# Patient Record
Sex: Male | Born: 1945 | Race: White | Hispanic: No | Marital: Married | Smoking: Former smoker
Health system: Southern US, Community
[De-identification: ages and names within clinical notes are randomized; demographics above are authoritative.]

## PROBLEM LIST (undated history)

## (undated) DIAGNOSIS — N200 Calculus of kidney: Secondary | ICD-10-CM

## (undated) DIAGNOSIS — G453 Amaurosis fugax: Secondary | ICD-10-CM

## (undated) DIAGNOSIS — N429 Disorder of prostate, unspecified: Secondary | ICD-10-CM

## (undated) DIAGNOSIS — I10 Essential (primary) hypertension: Secondary | ICD-10-CM

## (undated) DIAGNOSIS — I714 Abdominal aortic aneurysm, without rupture, unspecified: Secondary | ICD-10-CM

## (undated) HISTORY — DX: Calculus of kidney: N20.0

## (undated) HISTORY — DX: Abdominal aortic aneurysm, without rupture, unspecified: I71.40

## (undated) HISTORY — DX: Disorder of prostate, unspecified: N42.9

## (undated) HISTORY — DX: Essential (primary) hypertension: I10

## (undated) HISTORY — DX: Amaurosis fugax: G45.3

## (undated) HISTORY — DX: Abdominal aortic aneurysm, without rupture: I71.4

---

## 1998-06-12 HISTORY — PX: URETERAL STENT PLACEMENT: SHX822

## 2006-06-12 HISTORY — PX: FOOT SURGERY: SHX648

## 2006-06-12 HISTORY — PX: TOTAL KNEE ARTHROPLASTY: SHX125

## 2006-06-12 HISTORY — PX: CERVICAL SPINE SURGERY: SHX589

## 2006-12-21 ENCOUNTER — Ambulatory Visit (HOSPITAL_COMMUNITY): Admission: RE | Admit: 2006-12-21 | Discharge: 2006-12-22 | Payer: Self-pay | Admitting: Neurosurgery

## 2007-03-07 ENCOUNTER — Inpatient Hospital Stay (HOSPITAL_COMMUNITY): Admission: RE | Admit: 2007-03-07 | Discharge: 2007-03-11 | Payer: Self-pay | Admitting: Orthopedic Surgery

## 2007-04-04 ENCOUNTER — Encounter: Admission: RE | Admit: 2007-04-04 | Discharge: 2007-04-26 | Payer: Self-pay | Admitting: Orthopedic Surgery

## 2007-04-30 ENCOUNTER — Inpatient Hospital Stay (HOSPITAL_COMMUNITY): Admission: RE | Admit: 2007-04-30 | Discharge: 2007-05-03 | Payer: Self-pay | Admitting: Orthopedic Surgery

## 2007-05-28 ENCOUNTER — Encounter: Admission: RE | Admit: 2007-05-28 | Discharge: 2007-06-12 | Payer: Self-pay | Admitting: Orthopedic Surgery

## 2007-06-13 ENCOUNTER — Encounter: Admission: RE | Admit: 2007-06-13 | Discharge: 2007-09-11 | Payer: Self-pay | Admitting: Orthopedic Surgery

## 2007-09-12 ENCOUNTER — Encounter: Admission: RE | Admit: 2007-09-12 | Discharge: 2007-12-11 | Payer: Self-pay | Admitting: Orthopedic Surgery

## 2010-10-25 NOTE — Discharge Summary (Signed)
NAMEMOLLY, MASELLI              ACCOUNT NO.:  [id number]   MEDICAL RECORD NO.:  [id number]          PATIENT TYPE:  INP   LOCATION:  1607                         FACILITY:  [facility]   PHYSICIAN:  Madlyn Frankel. Charlann Boxer, M.D.  DATE OF BIRTH:  1945-10-24   DATE OF ADMISSION:  03/07/2007  DATE OF DISCHARGE:  03/11/2007                               DISCHARGE SUMMARY   ADMISSION DIAGNOSES:  1. Osteoarthritis.  2. Hypertension.  3. Prostate disease.   DISCHARGE DIAGNOSES:  1. Osteoarthritis.  2. Hypertension.  3. Prostate disease.   CONSULTATION:  None.   PROCEDURE:  Right total knee replacement by surgeon Dr. Durene Romans.  Assistant Dwyane Luo.   HISTORY OF PRESENT ILLNESS:  Mr. Batzel is a 65 year old male patient  with persistent progressive right knee pain secondary to osteoarthritis  that has been refractory all conservative treatments.   LABORATORY DATA:  Preadmission CBC:  Hematocrit 39, tracked throughout,  at discharge 27.5 and stable.  Coags negative.  Routine chemistries:  No  significant abnormalities.  Kidney function normal.  Calcium 8 at  discharge.  GI:  Negative.  UA showed some large bilirubin, otherwise  negative.   EKG:  Normal sinus rhythm.   Radiology:  Chest x-ray impression found in chart.   HOSPITAL COURSE:  The patient underwent right total knee replacement.  He tolerated the procedure well and was admitted to the orthopedic  floor.  Pain was well-controlled throughout.  He remained afebrile  throughout his course of stay.  DVT prophylaxis was started on postop  day #1.  Dressing was changed on a daily basis after postop day #1.  Wound had no significant drainage throughout his course of stay.  No  sign of infection.  He was PT, OT, weightbearing as tolerated.  Celebrex  was continued for 2 days after surgery.  He did have some issues with  constipation and was given some magnesium citrate which had resolved  this prior to his discharge.  He progressed  nicely with this physical  therapy and was able to ambulate at least 50 feet prior to his discharge  home with the use of a rolling walker.  No significant events during his  course of stay.   DISCHARGE DISPOSITION:  Discharged home with home health care, PT and  Lovenox teaching, in stable and improved condition.   DISCHARGE PHYSICAL THERAPY:  Goals of physical therapy to be to minimize  pain, maximize strength, increase range of motion, with goals of therapy  0 to 100 at 2 weeks, 0 to 120 at 6 weeks.  Work on Investment banker, operational and  proprioception.   DISCHARGE WOUND CARE:  Keep wound dry.   DISCHARGE DIET:  Regular, as tolerated by the patient.   DISCHARGE FOLLOWUP:  Follow up with Dr. Charlann Boxer at [place], [phone], in 2  weeks.   DISCHARGE MEDICATIONS:  1. HCTZ 12.5 mg 1 p.o. q.a.m.  2. Lyrica 75 mg 1 p.o. q.a.m.  3. Lovenox 30 mg subcu q.12h. x11 days.  4. Robaxin 500 mg p.o. q.6h. muscle spasm.  5. Iron 325 mg 1 p.o. t.i.d. x3 weeks.  6. Enteric-coated aspirin 325 mg 1 p.o. daily x4 weeks after Lovenox      completed.  7. Colace 100 mg p.o. b.i.d. constipation.  8. MiraLax 17 grams p.o. daily constipation.  9. Vicodin 5/325 1-2 p.o. q.4-6h. p.r.n. pain.   SPECIAL INSTRUCTIONS:  TED hose on 12 off 12 hours.     ______________________________  Yetta Glassman [address], [place]      Madlyn Frankel. Charlann Boxer, M.D.  Electronically Signed    BLM/MEDQ  D:  03/26/2007  T:  03/26/2007  Job:  [id number]   cc:   Birdena Jubilee, P.A.-C.

## 2010-10-25 NOTE — H&P (Signed)
Troy Blackburn, Troy Blackburn              ACCOUNT NO.:  [id number]   MEDICAL RECORD NO.:  [id number]          PATIENT TYPE:  INP   LOCATION:  NA                           FACILITY:  [facility]   PHYSICIAN:  Troy Blackburn, M.D.  DATE OF BIRTH:  1946-04-02   DATE OF ADMISSION:  04/30/2007  DATE OF DISCHARGE:                              HISTORY & PHYSICAL   PROCEDURE:  Left total knee arthroplasty.   CHIEF COMPLAINT:  Left knee pain.   HISTORY OF PRESENT ILLNESS:  65 year old male with a history of left  knee pain secondary to osteoarthritis refractory to all conservative  treatment.  He does have a history of recent right total knee  replacement and is doing very well.  He has been presurgically assessed  and cleared by Dr. Franky Blackburn.   PAST MEDICAL HISTORY:  1. Osteoarthritis with a recent right total knee replacement.  2. Hypertension.  3. Prostate disease.   PAST SURGICAL HISTORY:  1. Right total knee replacement in September of 2008.  2. Foot surgery in June of 2008.  3. Spine fusion in July of 2008.  4. Stent placement for kidney stones in 2000.   FAMILY HISTORY:  Stroke.   SOCIAL HISTORY:  The patient is married to Troy Blackburn.  Primary caregiver  will be his wife after surgery.   ALLERGIES:  CODEINE WHICH CAUSES ITCHING.   MEDICATIONS:  1. HCTZ, unknown dosage amount, one p.o. daily.  2. Enteric-coated aspirin 325 mg one p.o. daily.  3. Celebrex 100 mg p.o. b.i.d. x2 days after surgery.   REVIEW OF SYSTEMS:  See HPI.   PHYSICAL EXAMINATION:  VITAL SIGNS:  Pulse 72, respirations 18, blood  pressure 108/80.  GENERAL:  Awake, alert, and oriented.  Well-developed, well-nourished,  no acute distress.  NECK:  Supple.  No carotid bruits.  CHEST:  Lungs clear to auscultation bilaterally.  BREASTS:  Deferred.  HEART:  Regular rate and rhythm without gallops, clicks, rubs, or  murmurs.  ABDOMEN:  Soft, nontender, nondistended.  Bowel sounds present.  GENITOURINARY:  Deferred.  EXTREMITIES:  Right knee has 0 to 110-degree range of motion.  Left knee  varus deformity, does come out to full extension.  Flexion back to  approximately 95-100 degrees.  Does have a prominence over tibial  tuberosity on the Osgood-Schlatter area.  SKIN:  Right knee well-healed incision.  No signs of cellulitis on  either lower extremity.  NEUROLOGIC:  Intact distal sensibilities.   LABORATORY DATA:  Labs, EKG, chest x-ray pending presurgical testing.   IMPRESSION:  1. Osteoarthritis.  2. Hypertension.   PLAN:  Left total knee arthroplasty by surgeon Dr. Durene Blackburn.  The  risks and complications were discussed.   Postoperative medications including Lovenox Robaxin, iron, aspirin,  Colace, MiraLax provided at time of history and physical.  Celebrex also  provided for perioperative use.  Will be continued for 2 days after  surgery.  Pain medicines will be provided at time of surgery.     ______________________________  Troy Blackburn, [place]      Troy Blackburn, M.D.  Electronically Signed  BLM/MEDQ  D:  04/18/2007  T:  04/19/2007  Job:  [id number]   cc:   Troy Blackburn, M.D.  Fax: [phone]

## 2010-10-25 NOTE — Op Note (Signed)
Troy Blackburn, Troy Blackburn              ACCOUNT NO.:  [id number]   MEDICAL RECORD NO.:  [id number]          PATIENT TYPE:  OIB   LOCATION:  3172                         FACILITY:  [facility]   PHYSICIAN:  [name], M.D.     DATE OF BIRTH:  12/21/1945   DATE OF PROCEDURE:  12/21/2006  DATE OF DISCHARGE:                               OPERATIVE REPORT   PREOPERATIVE DIAGNOSIS:  1. Cervical spondylosis with myelopathy C3-C4.  2. Cervical degenerative disc disease with myelopathy.  3. Cervical radiculopathy.  4. Cervical stenosis.   POSTOPERATIVE DIAGNOSES:  1. Cervical spondylosis with myelopathy C3-C4.  2. Cervical degenerative disc disease with myelopathy.  3. Cervical radiculopathy.  4. Cervical stenosis.   PROCEDURE:  1. Anterior cervical decompression C3-C4.  2. Arthrodesis C3-C4 with 7 mm structural allograft.  3. Anterior instrumentation, 60 mm of Vectra plate.   INDICATIONS:  Troy Blackburn is a 65 year old who presented with  evidence of cervical myelopathy secondary to spinal cord stenosis at C3-  C4 with spinal cord signal.   DESCRIPTION OF PROCEDURE:  Troy Blackburn was brought to the operating room,  intubated, and placed under general anesthetic without difficulty.  His  head was positioned on a horseshoe headrest in a neutral position.  His  neck was prepped and he was draped in a sterile fashion.  I infiltrated  4 mL 0.5% lidocaine with 1:200,000 epinephrine.  I then opened the neck  with a #10 blade and I took this down to the platysma sharply with a #10  blade.  I dissected rostrally and caudally in a plane superior to the  platysma with Metzenbaum scissors.  I divided the platysma horizontally  with the same Metzenbaum scissors.  I then dissected rostrally and  caudally inferior to the platysma.  I was able to, with both blunt and  sharp dissection, create an avascular corridor to the cervical spine.  I  placed a spinal needle which, on x-ray, was at C3-C4.  Using that  as a  guide, I over reflected the longus colli muscles bilaterally and placed  self-retaining retractors.  I also opened the disc space at C3-C4.  I  then removed some of the disc material.  I placed two distraction pins,  one at C3, the other in C4, distracted the disc space, and proceeded  with the decompression.   I completed cervical decompression via curets, Kerrison punches, a high  speed drill, and pituitary rongeurs.  I drilled down the vertebral  bodies until I got to the osteophytes posteriorly.  I removed those by  drilling them until they were quite thin and then I was able to bite  them away using Kerrison punches both rostrally and caudally.  After  thorough decompression of the osteophytes, I opened the posterior  longitudinal ligament and removed that to fully decompress the spinal  canal.  This was also done for both C4 nerve roots.  After the  decompression was achieved, I turned towards the arthrodesis.   I was able to drill away more of the vertebral bodies to make sure I had  even spaces to  accept the bone graft.  I sized the space and placed a 7  mm graft into the space.  This was a Synthes ACS graft, I believe.  I  then prepared for anterior instrumentation by removing the distraction  pins.  I sized the space and felt that a 16 mm Vectra plate would be  appropriate.  With Dr. Verlee Rossetti assistance, I placed a plate by putting  two screws into the body of C3, two screws into the body of C4, first by  drilling then using self-tapping screws.  X-ray showed the plate, plugs  and screws to be in good position.   I closed the wound in a layered fashion using Vicryl sutures to  reapproximate the platysma and subcuticular layers.  Dermabond was used  for sterile dressing.  Troy Blackburn tolerated the procedure well.           ______________________________  [name], M.D.     KC/MEDQ  D:  12/21/2006  T:  12/22/2006  Job:  [id number]

## 2010-10-25 NOTE — Op Note (Signed)
NAMEESSAM, LOWDERMILK              ACCOUNT NO.:  [id number]   MEDICAL RECORD NO.:  [id number]          PATIENT TYPE:  INP   LOCATION:  1619                         [facility]ACILITY:  [facility]   PHYSICIAN:  Madlyn Frankel. Charlann Boxer, M.D.  DATE OF BIRTH:  Jul 09, 1945   DATE OF PROCEDURE:  04/30/2007  DATE OF DISCHARGE:                               OPERATIVE REPORT   PREOPERATIVE DIAGNOSIS:  Left knee osteoarthritis.   POSTOPERATIVE DIAGNOSIS:  Left knee osteoarthritis.   PROCEDURE:  Left total knee replacement using DePuy rotating platform  posterior stabilized knee system, size 5 femur, 4 tibia, 10 mm insert,  41 patellar button.   SURGEON:  Madlyn Frankel. Charlann Boxer, M.D.   ASSISTANT:  Yetta Glassman. Mann, P.A.-C.   ANESTHESIA:  Duramorph spinal.   COMPLICATIONS:  None.   DRAINS:  One.   TOURNIQUET TIME:  48 minutes at 250 mmHg.   DISPOSITION:  Stable to the recovery room.   INDICATIONS FOR PROCEDURE:  Mr. Muse is a 65 year old gentleman with  advanced bilateral knee osteoarthritis status post right total knee  replacement.  He has done extremely well and he is ready for scheduled  left total knee.  The risks and benefits were reviewed as he is recently  postop and consent was obtained.   PROCEDURE IN DETAIL:  The patient was brought to operating theater.  Once adequate anesthesia and preoperative antibiotics were administered,  the patient was positioned supine.  A proximal thigh tourniquet was  placed.  The left lower extremity was pre-scrubbed, prepped and draped  in a sterile fashion.  A midline incision was made followed by median  parapatellar arthrotomy.  Following this, a debridement including a  large calcified body within the inferior aspect of the patella.  Attention was directed to the patella.  Patella debridement removed  superior synovium.  Precut measurement was 23 mm.  I resected down to 14  mm, I used a 41 button.  I did debride the lateral facet so there was no  overhang bone  after placement of the button.   A metal shim was placed to protect the patella from the retractors. Now,  attention was turned to the femur.  The femoral canal was opened with  the drill and irrigated to prevent fat emboli.  The intramedullary rod  was passed and at 5 degrees valgus, I resected 10 mm of bone off the  distal femur.  I then sized the femur and it was a size 5.  The  anterior, posterior, and chamfer cuts were made based off the posterior  condylar axis which meant that the epicondylar axis, as well, was  perpendicular to the [employer].  The anterior, posterior, and  chamfer cuts were made without difficulty.  The final box cut was made  off the lateral aspect of the distal femur.  The tibia was subluxated  forward and further debridement of the menisci and cruciate ligament  stumps was carried out.  Then, using the extramedullary guide, I  resected 10 mm of bone off of the proximal tibia based on the proximal  lateral tibia.  Following this  cut, I debrided some of the medial  osteophytes and drilled the sclerotic bone on the medial proximal tibia.  A size 4 tibial tray seemed to fit best on this knee.  I checked my  position and alignment, and I was happy with the cut in both AP and  lateral planes.  I pinned the guide in position through the medial third  of the tubercle, and drilled and keel punched the tibia.  A trial  reduction was carried out with a 10 mm insert and the knee came out to  full extension and was stable through flexion.   All trial components were removed.  I used the laminar spreader at this  point to debride the osteophytes off the posterior, medial, and lateral  aspect of the knee.  There were no palpable loose bodies.  At this  point, the knee was irrigated with normal saline solution and pulse  lavaged.  I injected the synovial capsular layer with 60 mL of 0.25%  Marcaine with epinephrine and 1 mL of Toradol.  At this point, the final   components were cemented into position.  The knee was brought out to  full extension with a 10 mm insert.  Extruded cement was removed.  Once  the cement had cured, I removed the remaining cement that was visible, I  was unable to visualize any cement medially and laterally.  The final  size 5 by 10 mm insert was then placed without difficulty.  I did place  5 mL of FloSeal into the medial and lateral gutters as well as  posterior.  A medium Hemovac drain was placed deep.  The knee was  brought to flexion.  The extensor mechanism was closed with a #1 Vicryl.  The remainder of the wound was closed in layers with 2-0 Vicryl and a  running 4-0 Monocryl.  The knee was cleaned, dried, and dressed  sterilely with Steri-Strips and a bulky dressing.  He was brought to the  recovery room in stable condition.      Madlyn Frankel Charlann Boxer, M.D.  Electronically Signed     MDO/MEDQ  D:  04/30/2007  T:  04/30/2007  Job:  [id number]

## 2010-10-25 NOTE — H&P (Signed)
Troy Blackburn, Troy Blackburn              ACCOUNT NO.:  [id number]   MEDICAL RECORD NO.:  [id number]         PATIENT TYPE:  LINP   LOCATION:                               FACILITY:  [facility]   PHYSICIAN:  Madlyn Frankel. Charlann Boxer, M.D.  DATE OF BIRTH:  1945-06-17   DATE OF ADMISSION:  03/07/2007  DATE OF DISCHARGE:                              HISTORY & PHYSICAL   DATE OF ADMISSION:  Will be March 07, 2007   ATTENDING PHYSICIAN:  Dr. Durene Blackburn.   PROCEDURE:  Right total knee arthroplasty.   CHIEF COMPLAINT:  Right knee pain.   HISTORY OF PRESENT ILLNESS:  65 year old male with a history persistent  progressive right knee pain secondary to osteoarthritis.  It has been  refractory to all conservative treatments.  He has been presurgical  assessed by Troy Cradle PA-C and Dr. Franky Blackburn with [facility].   PAST MEDICAL HISTORY:  1. Osteoarthritis  2. Hypertension  3. Prostate disease.   PAST SURGICAL HISTORIES:  Foot in June 2008, spine fusion in July 2008,  unknown surgery to put in stent in 2000.   FAMILY HISTORY:  Stroke.   SOCIAL HISTORY:  The patient is married.  Primary caregiver after  surgery will be wife.   DRUG ALLERGIES:  CODEINE which causes itching.   MEDICATIONS:  1. Etodolac 600 mg p.o. b.i.d.  2. Hydrochlorothiazide unknown dosage amount one a day  3. Lyrica unknown dosage amount, one a day next  4. Celebrex 200 mg p.o. b.i.d. x2 days after surgery.   REVIEW OF SYSTEMS:  See HPI.   PHYSICAL EXAM:  Pulse 64, respirations 18, blood pressure 128/84.  GENERAL:  Awake, alert and oriented, well-developed, well-nourished, no  acute distress.  NECK:  Supple.  No carotid bruits.  CHEST/LUNGS:  Clear to auscultation bilaterally.  BREASTS:  Deferred.  HEART:  Regular rate and rhythm without gallops, clicks, rubs or  murmurs.  ABDOMEN:  Soft, nontender, nondistended.  Bowel sounds present.  GENITOURINARY:  Deferred.  EXTREMITIES:  Has increased pain with  range of motion of right knee.  Wears brace.  Also wears brace on left knee and has significant left  knee pain as well.  SKIN:  No skin breakdown.  No cellulitis.  NEUROLOGIC:  Intact distal sensibilities.   Labs, EKG, chest x-ray all pending.   IMPRESSION:  1. Osteoarthritis  2. History cervical spine surgery.   PLAN OF ACTION:  Right total knee arthroplasty March 07, 2007 at  [facility] by surgeon Dr. Durene Blackburn.  Risks and  complications were discussed.  Questions were encouraged, answered and  reviewed.   POSTOPERATIVE MEDICATIONS:  Including Lovenox Robaxin, iron, aspirin,  Colace, MiraLax were provided at time of history and physical.  Postoperative pain medication will need to be prescribed at time of  surgery.     ______________________________  Troy Blackburn Troy Blackburn, [place]      Madlyn Frankel. Charlann Boxer, M.D.  Electronically Signed    BLM/MEDQ  D:  02/21/2007  T:  02/21/2007  Job:  [id number]   cc:   Troy Blackburn, M.D.  Fax: [phone]

## 2010-10-25 NOTE — Op Note (Signed)
Troy Blackburn, Troy Blackburn              ACCOUNT NO.:  [id number]   MEDICAL RECORD NO.:  [id number]          PATIENT TYPE:  INP   LOCATION:  1607                         FACILITY:  [facility]   PHYSICIAN:  Madlyn Frankel. Charlann Boxer, M.D.  DATE OF BIRTH:  05-Nov-1945   DATE OF PROCEDURE:  03/07/2007  DATE OF DISCHARGE:                               OPERATIVE REPORT   PREOPERATIVE DIAGNOSIS:  Right knee end stage osteoarthritis with severe  varus deformity.   POSTOPERATIVE DIAGNOSIS:  Right knee end stage osteoarthritis with  severe varus deformity.   PROCEDURE:  Right total knee replacement.   COMPONENTS USED:  DePuy rotating platform posterior stabilized knee  system, size 5 femur, 5 tibia, 12.5 mm insert, 41 patellar button.   SURGEON:  Madlyn Frankel. Charlann Boxer, M.D.   ASSISTANT:  Yetta Glassman. Mann, P.A.-C.   ANESTHESIA:  Duramorph spinal.   DRAINS:  One.   TOURNIQUET TIME:  55 minutes at 250 mmHg.   COMPLICATIONS:  None.   INDICATIONS FOR PROCEDURE:  Mr. Kemmerling is a 65 year old gentleman who  presented to office on referral for evaluation of bilateral knee pain.  He has a history of lumbar spine surgery with persistent knee  discomfort.  Radiographs revealed end stage bilateral knee  osteoarthritis with severe varus deformity.  Conservative measures  failed to provide relief and he wished to proceed with surgical  intervention based on his decreased quality of life.  The risks of  infection, DVT, component failure, need for revision surgery, were all  discussed and reviewed.  Consent was obtained.   PROCEDURE IN DETAIL:  The patient was brought to the operating theater.  Once adequate anesthesia and preoperative antibiotics, Ancef, were  administered, the patient was positioned supine.  A proximal thigh  tourniquet was placed.  The right lower extremity was pre-scrubbed and  prepped and draped in a sterile fashion.  A midline incision was made  which was somewhat paramidline due to the fact he had  a very large  prominent tibial tubercle.  Medial parapatellar arthrotomy was created  to allow for patella subluxation.  Following initial debridement, first  attention was directed to the patella. Precut measurement was 23 mm.  I  resected down to 14 mm and used a 41 patellar button.  I did debride  some of the lateral facet of the patella and remaining osteophytes to  shape the patella to a more anatomic position.   At this point, attention was directed to the femur.  The femoral canal  was opened and irrigated to prevent fat emboli.  An intramedullary rod  was passed and at 5 degrees of valgus, I chose to resect 11 mm of bone  due to a 5 degrees flex contracture.  Following this cut, I sized the  femur to be a size 5.  Anterior, posterior, and chamfer cuts were based  off the posterior condylar axis which was perpendicular to [place]  line and the AP axis.  Final box cut preparation was carried out based  off the lateral aspect of distal femur.   Attention was now directed to the tibia. With  tibial subluxation and  further debridement of the meniscus and stump of the PCL, an  extramedullary guide was used and I resected 10 mm of bone off the  proximal tibia.  Following this cut, I used extension block to determine  that the knee came out to at least extension at this point.  The pins  were removed and attention was now directed to the posterior aspect of  the knee. I utilized the 5/8 osteotome to remove osteophytes and a curet  to remove these.  Following this, the final tibial preparation was  carried out with size 5 tibial tray.  The alignment rod passed to the  center of the ankle indicating a good perpendicular cut.   At this point, the drill and keel punch were carried out and a trial  reduction now carried out. With a 12.5 poly, the knee came out to full  extension and appeared to be stable from extension into flexion. Given  all these parameters and knowing that the  patella tracked without  application of any pressure, the trial components were removed and the  knee was irrigated with normal saline solution and pulse lavage.  The  final components were opened and cement mixed.  The knee was injected 60  mL of 0.25% Marcaine with epinephrine and 1 mL of Toradol.  Once this  was done, the components were cemented into position.  The knee was  brought to extension with the 12.5 poly and the extruded excessive  cement removed.  Once the cement had cured, I made sure there was no  excessive cement throughout the knee.  Once I was satisfied there was  none, the final 12.5 poly was inserted.   The knee was again irrigated with pulse lavage.  I then injected 5 mL of  FloSeal into the medial and lateral gutters.  The medium Hemovac drain  was placed deep.  The extensor mechanism was then reapproximated in  flexion with #1 Vicryl.  The remainder of the wound was closed in  layers, 2-0 Vicryl and a 4-0 running Monocryl.  The knee was cleaned,  dried, and dressed sterilely with Steri-Strips and a bulky sterile  dressing.  He is brought to the recovery room in stable condition.      Madlyn Frankel Charlann Boxer, M.D.  Electronically Signed     MDO/MEDQ  D:  03/07/2007  T:  03/07/2007  Job:  [id number]

## 2010-10-25 NOTE — Discharge Summary (Signed)
NAMESHERRON, MUMMERT              ACCOUNT NO.:  [id number]   MEDICAL RECORD NO.:  [id number]          PATIENT TYPE:  INP   LOCATION:  1619                         FACILITY:  [facility]   PHYSICIAN:  Madlyn Frankel. Charlann Boxer, M.D.  DATE OF BIRTH:  31-Jan-1946   DATE OF ADMISSION:  04/30/2007  DATE OF DISCHARGE:  05/03/2007                               DISCHARGE SUMMARY   DISCHARGE DIAGNOSES:  1. Osteoarthritis.  2. Hypertension.  3. Prostate disease.   DISCHARGE DIAGNOSES:  1. Osteoarthritis.  2. Hypertension.  3. Prostate disease.   CONSULTATION:  None.   PROCEDURE:  Left total knee replacement.  Surgeon:  Dr. Durene Romans.  Assistant:  Dwyane Luo PA-C.   BRIEF HISTORY OF PRESENT ILLNESS:  A 65 year old male with history of  left knee pain secondary to osteoarthritis refractory to all  conservative treatment.  Does have a history of a recent right total  knee replacement and is doing very well.  Was presurgically assessed and  cleared by [name].   LABORATORY DATA:  CBC shows hematocrit to be 28.9,  at discharge was  28.7.  Routine chemistries all within normal limits with no significant  changes and stable at discharge with sodium 136, potassium 3.7,  creatinine 0.87, glucose 121.   Cardiology:  No EKG found on chart.   Chest two-view not found of this chart.   HOSPITAL COURSE:  The patient underwent left total knee replacement and  tolerated the procedure well.  He was admitted to the orthopedic floor.  He remained afebrile throughout his course of stay.  Dressing was  changed on a daily basis after postop day #1.  No significant drainage  from wound at any time.  He remained neurovascularly to his left lower  extremity, was able to do a positive straight leg raise with good quad  function on postop day #1.  He was PT, OT weightbearing as tolerated.  We did hold his blood pressure medications for the first day due to low  systolic pressure.  He progressed nicely with PT and OT,  was  weightbearing as tolerated, was able to ambulate at least 50 feet prior  to discharge.   On postop day #3, doing well.  Dressing change on wound looked great,  was weightbearing as tolerated and ready for discharge home.   DISCHARGE DISPOSITION:  Discharged home with home health care PT and  Lovenox teaching.   DISCHARGE PHYSICAL THERAPY:  Goals of physical therapy:  Work on gait  training, proprioception, and range of motion with goals for 0-100 at 2  weeks and 0-120 at 6 weeks.   DISCHARGE DIET:  Regular as tolerated by the patient.   DISCHARGE WOUND CARE:  Keep dry.   DISCHARGE MEDICATIONS:  1. Hydrochlorothiazide 12.5 mg one p.o. daily.  2. Celebrex 200 mg p.o. b.i.d. x2 days and then daily for 2 more days.  3. Lovenox 40 mg subcutaneously q. 24 h x11 days.  4. Robaxin 500 mg p.o. q. 6 h.  5. Vicodin 5/325 one to two p.o. q. 4-6 h p.r.n. pain.   DISCHARGE INSTRUCTIONS:  1.  Follow up with Dr. Charlann Boxer, phone number [phone] in 10-14 days for      wound check.  2. He is weightbearing as tolerated.  3. TED hose on 12 hours, off 12 hours.     ______________________________  Yetta Glassman [address], [place]      Madlyn Frankel. Charlann Boxer, M.D.  Electronically Signed    BLM/MEDQ  D:  05/27/2007  T:  05/27/2007  Job:  [id number]   cc:   [name], M.D.  Fax: [phone]

## 2011-03-21 LAB — CBC
MCHC: 34.8
MCHC: 34.9
MCV: 89.5
MCV: 89.4
Platelets: 198
RBC: 3.2 — ABNORMAL LOW
RBC: 4.47
RDW: 13.5
WBC: 4.1
WBC: 5

## 2011-03-21 LAB — BASIC METABOLIC PANEL
BUN: 10
CO2: 26
CO2: 27
Calcium: 7.9 — ABNORMAL LOW
Chloride: 105
Creatinine, Ser: 0.87
Creatinine, Ser: 0.95
GFR calc Af Amer: >60
Glucose, Bld: 108 — ABNORMAL HIGH

## 2011-03-21 LAB — COMPREHENSIVE METABOLIC PANEL
ALT: 17
AST: 16
Alkaline Phosphatase: 89
CO2: 26
Chloride: 104
Creatinine, Ser: 0.89
GFR calc Af Amer: >60
GFR calc non Af Amer: >60
Total Bilirubin: 0.8

## 2011-03-21 LAB — URINALYSIS, ROUTINE W REFLEX MICROSCOPIC
Bilirubin Urine: NEGATIVE
Glucose, UA: NEGATIVE
Hgb urine dipstick: NEGATIVE
Ketones, ur: NEGATIVE
pH: 7

## 2011-03-21 LAB — APTT: aPTT: 31

## 2011-03-23 LAB — BASIC METABOLIC PANEL
BUN: 11
Calcium: 8.2 — ABNORMAL LOW
GFR calc non Af Amer: >60
GFR calc non Af Amer: >60
Glucose, Bld: 96
Potassium: 4
Sodium: 139
Sodium: 142

## 2011-03-23 LAB — CBC
HCT: 27.5 — ABNORMAL LOW
HCT: 39
Hemoglobin: 13.3
Hemoglobin: 9.6 — ABNORMAL LOW
MCHC: 34.1
Platelets: 160
RBC: 2.99 — ABNORMAL LOW
RDW: 13
RDW: 13.2
WBC: 4.6

## 2011-03-23 LAB — COMPREHENSIVE METABOLIC PANEL
ALT: 22
AST: 21
Albumin: 3.9
CO2: 31
Chloride: 106
Creatinine, Ser: 0.84
Total Bilirubin: 1.2

## 2011-03-23 LAB — APTT: aPTT: 30

## 2011-03-23 LAB — URINALYSIS, ROUTINE W REFLEX MICROSCOPIC
Glucose, UA: NEGATIVE
Nitrite: NEGATIVE
Specific Gravity, Urine: 1.021
pH: 6

## 2011-03-23 LAB — TYPE AND SCREEN: Antibody Screen: NEGATIVE

## 2011-03-23 LAB — ABO/RH: ABO/RH(D): A POS

## 2011-03-28 LAB — BASIC METABOLIC PANEL
BUN: 17
Chloride: 107
Glucose, Bld: 97
Potassium: 4.5

## 2011-03-28 LAB — CBC
HCT: 37.9 — ABNORMAL LOW
MCV: 90
Platelets: 219
WBC: 5

## 2013-09-29 ENCOUNTER — Encounter: Payer: Self-pay | Admitting: Family Medicine

## 2013-09-29 DIAGNOSIS — G453 Amaurosis fugax: Secondary | ICD-10-CM

## 2013-09-29 HISTORY — DX: Amaurosis fugax: G45.3

## 2017-08-04 ENCOUNTER — Emergency Department (HOSPITAL_COMMUNITY)
Admission: EM | Admit: 2017-08-04 | Discharge: 2017-08-04 | Disposition: A | Discharge status: Home / Self Care | Payer: Medicare Other | Attending: Emergency Medicine | Admitting: Emergency Medicine

## 2017-08-04 ENCOUNTER — Emergency Department (HOSPITAL_COMMUNITY): Payer: Medicare Other

## 2017-08-04 ENCOUNTER — Encounter (HOSPITAL_COMMUNITY): Payer: Self-pay | Admitting: *Deleted

## 2017-08-04 DIAGNOSIS — N3090 Cystitis, unspecified without hematuria: Secondary | ICD-10-CM | POA: Insufficient documentation

## 2017-08-04 DIAGNOSIS — I10 Essential (primary) hypertension: Secondary | ICD-10-CM | POA: Diagnosis not present

## 2017-08-04 DIAGNOSIS — I714 Abdominal aortic aneurysm, without rupture, unspecified: Secondary | ICD-10-CM

## 2017-08-04 DIAGNOSIS — Z96653 Presence of artificial knee joint, bilateral: Secondary | ICD-10-CM | POA: Insufficient documentation

## 2017-08-04 DIAGNOSIS — R339 Retention of urine, unspecified: Secondary | ICD-10-CM | POA: Diagnosis present

## 2017-08-04 LAB — BASIC METABOLIC PANEL
ANION GAP: 12 (ref 5–15)
BUN: 16 mg/dL (ref 6–20)
CALCIUM: 9 mg/dL (ref 8.9–10.3)
CHLORIDE: 102 mmol/L (ref 101–111)
CO2: 21 mmol/L — AB (ref 22–32)
Creatinine, Ser: 0.9 mg/dL (ref 0.61–1.24)
GFR calc non Af Amer: >60 mL/min (ref >60)
Glucose, Bld: 98 mg/dL (ref 65–99)
Potassium: 3.8 mmol/L (ref 3.5–5.1)
Sodium: 135 mmol/L (ref 135–145)

## 2017-08-04 LAB — URINALYSIS, ROUTINE W REFLEX MICROSCOPIC
Bilirubin Urine: NEGATIVE
Glucose, UA: NEGATIVE mg/dL
KETONES UR: 5 mg/dL — AB
Nitrite: POSITIVE — AB
PH: 6 (ref 5.0–8.0)
Protein, ur: 100 mg/dL — AB
Specific Gravity, Urine: 1.009 (ref 1.005–1.030)

## 2017-08-04 LAB — CBC WITH DIFFERENTIAL/PLATELET
BASOS ABS: 0 10*3/uL (ref 0.0–0.1)
Basophils Relative: 0 %
EOS PCT: 0 %
Eosinophils Absolute: 0 10*3/uL (ref 0.0–0.7)
HCT: 39.5 % (ref 39.0–52.0)
Hemoglobin: 13.1 g/dL (ref 13.0–17.0)
LYMPHS PCT: 11 %
Lymphs Abs: 1.2 10*3/uL (ref 0.7–4.0)
MCH: 30.6 pg (ref 26.0–34.0)
MCHC: 33.2 g/dL (ref 30.0–36.0)
MCV: 92.3 fL (ref 78.0–100.0)
Monocytes Absolute: 0.6 10*3/uL (ref 0.1–1.0)
Monocytes Relative: 6 %
Neutro Abs: 9.3 10*3/uL — ABNORMAL HIGH (ref 1.7–7.7)
Neutrophils Relative %: 83 %
PLATELETS: 240 10*3/uL (ref 150–400)
RBC: 4.28 MIL/uL (ref 4.22–5.81)
RDW: 13 % (ref 11.5–15.5)
WBC: 11.1 10*3/uL — AB (ref 4.0–10.5)

## 2017-08-04 LAB — LACTIC ACID, PLASMA
LACTIC ACID, VENOUS: 0.9 mmol/L (ref 0.5–1.9)
Lactic Acid, Venous: 0.7 mmol/L (ref 0.5–1.9)

## 2017-08-04 MED ORDER — CEFTRIAXONE SODIUM 1 G IJ SOLR
1.0000 g | Freq: Once | INTRAMUSCULAR | Status: AC
  Administered 2017-08-04: 1 g via INTRAVENOUS
  Filled 2017-08-04: qty 10

## 2017-08-04 MED ORDER — CEPHALEXIN 500 MG PO CAPS: 500.0000 mg | ORAL_CAPSULE | Freq: Four times a day (QID) | ORAL | 0 refills | Status: DC

## 2017-08-04 NOTE — Discharge Instructions (Signed)
Your CT scan showed an incidental finding(s):  "1) Low-density structures within the central LEFT kidney - likely parapelvic cysts.; 2) 4.2 cm abdominal aortic aneurysm. Recommend followup by ultrasound in 1 year. This recommendation follows ACR consensus guidelines: White Paper of the ACR Incidental Findings Committee II on Vascular Findings. J Am Coll Radiol 2013; 10:789-794."  Your regular medical doctor or a Vascular Surgeon can follow up this aortic finding. The Urologist can follow up the kidney findings. Take the prescription as directed. Continue to keep the foley catheter in place until you are seen in follow up.  Call your regular medical doctor on Monday to schedule a follow up appointment within the next 3 days. Call the Urologist on Monday to schedule a follow up appointment this week. Call the Vascular Surgeon on Monday to schedule a follow up appointment within the next few weeks.  Return to the Emergency Department immediately sooner if worsening.

## 2017-08-04 NOTE — ED Provider Notes (Signed)
[facility] EMERGENCY DEPARTMENT Provider Note   CSN: [id number] Arrival date & time: 08/04/17  1607     History   Chief Complaint Chief Complaint  Patient presents with  . Flank Pain    HPI STACIE KNUTZEN is a 72 y.o. male.  HPI  Pt was seen at 1925. Per pt, c/o gradual onset and persistence of intermittent dysuria for the past several days. Pt states he was evaluated by his Uro MD on Tuesday (4 days ago), dx urinary retention, and was taught how to self cath (vs foley placement). Pt states he has been performing self cath BID.  Pt states he occasionally has been "seeing blood" on the end of his catheter after performing self cath, having intermittent dysuria, and having lower abd "pressure." Pt states he "feels like my bladder is full and I have to urinate" but only dribbles. Pt called his Uro MD office and was told to come to the ED for further evaluation. Denies hematuria, no flank pain, no abd pain, no fevers, no N/V/D, no testicular pain/swelling, no rash, no penile discharge.   Past Medical History:  Diagnosis Date  . Amaurosis fugax 09/29/2013  . Hypertension   . Kidney stone   . Prostate disease     There are no active problems to display for this patient.   Past Surgical History:  Procedure Laterality Date  . CERVICAL SPINE SURGERY  2008  . FOOT SURGERY  2008  . TOTAL KNEE ARTHROPLASTY Left 2008   Dr Charlann Boxer  . TOTAL KNEE ARTHROPLASTY Right 2008  . URETERAL STENT PLACEMENT  2000   urethral stone       Home Medications    Prior to Admission medications   Not on File    Family History History reviewed. No pertinent family history.  Social History Social History   Tobacco Use  . Smoking status: Former Games developer  . Smokeless tobacco: Never Used  Substance Use Topics  . Alcohol use: No    Frequency: Never  . Drug use: No     Allergies   Codeine   Review of Systems Review of Systems ROS: Statement: All systems negative except as marked or  noted in the HPI; Constitutional: Negative for fever and chills. ; ; Eyes: Negative for eye pain, redness and discharge. ; ; ENMT: Negative for ear pain, hoarseness, nasal congestion, sinus pressure and sore throat. ; ; Cardiovascular: Negative for chest pain, palpitations, diaphoresis, dyspnea and peripheral edema. ; ; Respiratory: Negative for cough, wheezing and stridor. ; ; Gastrointestinal: +lower abd "pressure." Negative for abdominal pain, nausea, vomiting, diarrhea, blood in stool, hematemesis, jaundice and rectal bleeding. . ; ; Genitourinary: Negative for flank pain. +dysuria and hematuria. ; ; Genital:  No penile drainage or rash, no testicular pain or swelling, no scrotal rash or swelling. ;; Musculoskeletal: Negative for back pain and neck pain. Negative for swelling and trauma.; ; Skin: Negative for pruritus, rash, abrasions, blisters, bruising and skin lesion.; ; Neuro: Negative for headache, lightheadedness and neck stiffness. Negative for weakness, altered level of consciousness, altered mental status, extremity weakness, paresthesias, involuntary movement, seizure and syncope.       Physical Exam Updated Vital Signs BP (!) 134/100 (BP Location: Right Arm)   Pulse 100   Resp 18   Ht 5\' 11"  (1.803 m)   Wt 95.3 kg (210 lb)   SpO2 99%   BMI 29.29 kg/m   BP 140/90 (BP Location: Left Arm)   Pulse 77  Temp 98.1 F (36.7 C) (Oral)   Resp 20   Ht 5\' 11"  (1.803 m)   Wt 95.3 kg (210 lb)   SpO2 98%   BMI 29.29 kg/m    Physical Exam 1930: Physical examination:  Nursing notes reviewed; Vital signs and O2 SAT reviewed;  Constitutional: Well developed, Well nourished, Well hydrated, In no acute distress; Head:  Normocephalic, atraumatic; Eyes: EOMI, PERRL, No scleral icterus; ENMT: Mouth and pharynx normal, Mucous membranes moist; Neck: Supple, Full range of motion, No lymphadenopathy; Cardiovascular: Regular rate and rhythm, No gallop; Respiratory: Breath sounds clear & equal  bilaterally, No wheezes.  Speaking full sentences with ease, Normal respiratory effort/excursion; Chest: Nontender, Movement normal; Abdomen: Soft, Nontender, Nondistended, Normal bowel sounds; Genitourinary: No CVA tenderness; Spine:  No midline CS, TS, LS tenderness. No rash.;; Extremities: Pulses normal, No tenderness, No edema, No calf edema or asymmetry.; Neuro: AA&Ox3, Major CN grossly intact.  Speech clear. No gross focal motor or sensory deficits in extremities. Climbs on and off stretcher easily by himself. Gait steady..; Skin: Color normal, Warm, Dry.   ED Treatments / Results  Labs (all labs ordered are listed, but only abnormal results are displayed)   EKG  EKG Interpretation None       Radiology   Procedures Procedures (including critical care time)  Medications Ordered in ED Medications - No data to display   Initial Impression / Assessment and Plan / ED Course  I have reviewed the triage vital signs and the nursing notes.  Pertinent labs & imaging results that were available during my care of the patient were reviewed by me and considered in my medical decision making (see chart for details).  MDM Reviewed: previous chart, nursing note and vitals Reviewed previous: labs Interpretation: labs and CT scan   Results for orders placed or performed during the hospital encounter of 08/04/17  Urinalysis, Routine w reflex microscopic  Result Value Ref Range   Color, Urine YELLOW YELLOW   APPearance CLOUDY (A) CLEAR   Specific Gravity, Urine 1.009 1.005 - 1.030   pH 6.0 5.0 - 8.0   Glucose, UA NEGATIVE NEGATIVE mg/dL   Hgb urine dipstick MODERATE (A) NEGATIVE   Bilirubin Urine NEGATIVE NEGATIVE   Ketones, ur 5 (A) NEGATIVE mg/dL   Protein, ur 960 (A) NEGATIVE mg/dL   Nitrite POSITIVE (A) NEGATIVE   Leukocytes, UA MODERATE (A) NEGATIVE   RBC / HPF TOO NUMEROUS TO COUNT 0 - 5 RBC/hpf   WBC, UA TOO NUMEROUS TO COUNT 0 - 5 WBC/hpf   Bacteria, UA RARE (A) NONE SEEN    Squamous Epithelial / LPF 0-5 (A) NONE SEEN   WBC Clumps PRESENT   Basic metabolic panel  Result Value Ref Range   Sodium 135 135 - 145 mmol/L   Potassium 3.8 3.5 - 5.1 mmol/L   Chloride 102 101 - 111 mmol/L   CO2 21 (L) 22 - 32 mmol/L   Glucose, Bld 98 65 - 99 mg/dL   BUN 16 6 - 20 mg/dL   Creatinine, Ser 4.54 0.61 - 1.24 mg/dL   Calcium 9.0 8.9 - 09.8 mg/dL   GFR calc non Af Amer >60 >60 mL/min   GFR calc Af Amer >60 >60 mL/min   Anion gap 12 5 - 15  Lactic acid, plasma  Result Value Ref Range   Lactic Acid, Venous 0.7 0.5 - 1.9 mmol/L  CBC with Differential  Result Value Ref Range   WBC 11.1 (H) 4.0 - 10.5 K/uL  RBC 4.28 4.22 - 5.81 MIL/uL   Hemoglobin 13.1 13.0 - 17.0 g/dL   HCT 16.1 09.6 - 04.5 %   MCV 92.3 78.0 - 100.0 fL   MCH 30.6 26.0 - 34.0 pg   MCHC 33.2 30.0 - 36.0 g/dL   RDW 40.9 81.1 - 91.4 %   Platelets 240 150 - 400 K/uL   Neutrophils Relative % 83 %   Neutro Abs 9.3 (H) 1.7 - 7.7 K/uL   Lymphocytes Relative 11 %   Lymphs Abs 1.2 0.7 - 4.0 K/uL   Monocytes Relative 6 %   Monocytes Absolute 0.6 0.1 - 1.0 K/uL   Eosinophils Relative 0 %   Eosinophils Absolute 0.0 0.0 - 0.7 K/uL   Basophils Relative 0 %   Basophils Absolute 0.0 0.0 - 0.1 K/uL   Ct Renal Stone Study Result Date: 08/04/2017 CLINICAL DATA:  72 year old male with abdominal and pelvic pain with difficulty urinating. Recently started to self-catheterize. EXAM: CT ABDOMEN AND PELVIS WITHOUT CONTRAST TECHNIQUE: Multidetector CT imaging of the abdomen and pelvis was performed following the standard protocol without IV contrast. COMPARISON:  None. FINDINGS: Please note that parenchymal abnormalities may be missed without intravenous contrast. Lower chest: No acute abnormality. Hepatobiliary: The liver and gallbladder are unremarkable. No biliary dilatation. Pancreas: Unremarkable Spleen: Unremarkable Adrenals/Urinary Tract: Low density structures within the central LEFT kidney more likely represent  parapelvic cysts and mild hydronephrosis as there is no dilatation of the LEFT renal pelvis or ureter. The RIGHT kidney is unremarkable. A Foley catheter within a thick-walled bladder noted with mild perivesical stranding/inflammation. LEFT adrenal hyperplasia in noted. Stomach/Bowel: Stomach is within normal limits. Appendix appears normal. No evidence of bowel wall thickening, distention, or inflammatory changes. Colonic diverticulosis noted without evidence of diverticulitis. Vascular/Lymphatic: Infrarenal abdominal aortic aneurysm noted with the mid abdominal aorta measuring 4.2 cm in the distal abdominal aorta measuring 3.5 cm in greatest diameter. Aortic atherosclerotic calcifications are present. No enlarged lymph nodes are identified. Reproductive: Prostate appears mildly enlarged. Other: No ascites or focal collection. A small to moderate supraumbilical midline ventral hernia containing only fat is noted. Musculoskeletal: No acute abnormality or suspicious bony lesion. Multilevel degenerative changes throughout the lumbar spine identified. IMPRESSION: 1. Foley catheter within a thick-walled bladder with mild adjacent stranding/inflammation. Correlate with infection. 2. Low-density structures within the central LEFT kidney - likely parapelvic cysts rather than mild hydronephrosis. 3. 4.2 cm abdominal aortic aneurysm. Recommend followup by ultrasound in 1 year. This recommendation follows ACR consensus guidelines: White Paper of the ACR Incidental Findings Committee II on Vascular Findings. J Am Coll Radiol 2013; 10:789-794. 4. Small to moderate supraumbilical midline ventral hernia containing fat. Electronically Signed   By: Harmon Pier M.D.   On: 08/04/2017 21:12    2140:   Bladder scan on arrival with urine. Foley placed and bladder drained. Pt felt improved. +UTI, UC pending; will dose IV rocephin. Pt does not want to go back to his Uro MD at [facility] and requests f/u with local Uro MD "if that's  ok with them."  T/C to Uro Dr. Sherryl Barters, case discussed, including:  HPI, pertinent PM/SHx, VS/PE, dx testing, ED course and treatment:  Agreeable with ED tx, rx abx, continue foley, call office on Monday for f/u appointment. Dx and testing, as well as d/w Uro MD, d/w pt and family.  Questions answered.  Verb understanding, agreeable to d/c home with outpt f/u. Pt and family very thankful that local Uro MD will see him in  f/u.        Final Clinical Impressions(s) / ED Diagnoses   Final diagnoses:  None    ED Discharge Orders    None       Samuel JesterMcManus, Syria Kestner, DO 08/07/17 1226

## 2017-08-04 NOTE — ED Notes (Signed)
Bladder scanner revealed of urine

## 2017-08-04 NOTE — ED Triage Notes (Signed)
Pt recently started to self cath , c/o pain to lower abd, denies fevers at home

## 2017-08-07 LAB — URINE CULTURE: Culture: >=100000 — AB

## 2017-08-08 ENCOUNTER — Telehealth: Payer: Self-pay | Admitting: Emergency Medicine

## 2017-08-08 NOTE — Telephone Encounter (Signed)
Post ED Visit - Positive Culture Follow-up: Successful Patient Follow-Up  Culture assessed and recommendations reviewed by: []  Enzo BiNathan Batchelder, Pharm.D. []  Celedonio MiyamotoJeremy Frens, Pharm.D., BCPS AQ-ID []  Garvin FilaMike Maccia, Pharm.D., BCPS [x]  Georgina PillionElizabeth Martin, [address]Ph[address]D., BCPS []  Sleepy HollowMinh Pham, [address]Pharm.D., BCPS, AAHIVP []  Estella HuskMichelle Turner, Pharm.D., BCPS, AAHIVP []  Lysle Pearlachel Rumbarger, PharmD, BCPS []  Casilda Carlsaylor Stone, PharmD, BCPS []  Pollyann SamplesAndy Johnston, PharmD, BCPS  Positive urine culture  []  Patient discharged without antimicrobial prescription and treatment is now indicated [x]  Organism is resistant to prescribed ED discharge antimicrobial []  Patient with positive blood cultures  Changes discussed with ED provider: Frederik PearMia McDonald PA New antibiotic prescription stop cephalexin,start cipro 500mg  po bid x 7 days [facility]  Contacted patient, 08/08/2017 1420   Berle MullMiller, Brooklynn Brandenburg 08/08/2017, 2:19 PM

## 2017-08-08 NOTE — Progress Notes (Signed)
ED Antimicrobial Stewardship Positive Culture Follow Up   Troy Blackburn is an 72 y.o. male who [facility] with a chief complaint of  Chief Complaint  Patient presents with  . Flank Pain    Recent Results (from the past 720 hour(s))  Urine culture     Status: Abnormal   Collection Time: 08/04/17  8:10 PM  Result Value Ref Range Status   Specimen Description   Final    URINE, CLEAN CATCH Performed at [facility], [address] [place], [place] [address]    Special Requests   Final    NONE Performed at [facility], [address] [place], [place] [address]    Culture >=100,000 COLONIES/mL ENTEROBACTER SPECIES (A)  Final   Report Status 08/07/2017 FINAL  Final   Organism ID, Bacteria ENTEROBACTER SPECIES (A)  Final      Susceptibility   Enterobacter species - MIC*    CEFAZOLIN >=64 RESISTANT Resistant     CEFTRIAXONE <=1 SENSITIVE Sensitive     CIPROFLOXACIN <=0.25 SENSITIVE Sensitive     GENTAMICIN <=1 SENSITIVE Sensitive     IMIPENEM 0.5 SENSITIVE Sensitive     NITROFURANTOIN 64 INTERMEDIATE Intermediate     TRIMETH/SULFA <=20 SENSITIVE Sensitive     PIP/TAZO <=4 SENSITIVE Sensitive     * >=100,000 COLONIES/mL ENTEROBACTER SPECIES    [x]  Treated with Keflex, organism resistant to prescribed antimicrobial []  Patient discharged originally without antimicrobial agent and treatment is now indicated  New antibiotic prescription: Ciprofloxacin 500 mg po bid x 7 days  ED Provider: Frederik PearMia McDonald, PA-C  Rolley SimsMartin, Aradia Estey Ann 08/08/2017, 8:55 AM Infectious Diseases Pharmacist Phone# [phone]

## 2017-09-21 ENCOUNTER — Encounter: Payer: Self-pay | Admitting: Vascular Surgery

## 2017-09-21 ENCOUNTER — Ambulatory Visit: Payer: Medicare Other | Admitting: Vascular Surgery

## 2017-09-21 ENCOUNTER — Other Ambulatory Visit: Payer: Self-pay

## 2017-09-21 VITALS — BP 146/102 | HR 83 | Temp 98.3°F | Resp 16 | Ht 71.0 in | Wt 208.6 lb

## 2017-09-21 DIAGNOSIS — I714 Abdominal aortic aneurysm, without rupture, unspecified: Secondary | ICD-10-CM

## 2017-09-21 NOTE — Progress Notes (Signed)
Patient ID: Troy Blackburn, male   DOB: 03-01-1946, 72 y.o.   MRN: [id number]  Reason for Consult: New Patient (Initial Visit) (AAA found on CT renal stone study 08/04/17.)   Referred by Troy Inch, MD  Subjective:     HPI:  Troy Blackburn is a 72 y.o. male with a history of hypertension and kidney stones.  Recently he underwent CT scan for kidney stones which demonstrated a abdominal aortic aneurysm.  He does not have any personal or family history of known aneurysms.  He is a former smoker but quit several years ago.  He is not diabetic.  He does have frequent UTIs and has been placed on ciprofloxacin multiple times.  He does not have any new back or abdominal pain.  He has never had a stroke.  Past Medical History:  Diagnosis Date  . Amaurosis fugax 09/29/2013  . Hypertension   . Kidney stone   . Prostate disease    No family history on file. Past Surgical History:  Procedure Laterality Date  . CERVICAL SPINE SURGERY  2008  . FOOT SURGERY  2008  . TOTAL KNEE ARTHROPLASTY Left 2008   Dr Charlann Boxer  . TOTAL KNEE ARTHROPLASTY Right 2008  . URETERAL STENT PLACEMENT  2000   urethral stone    Short Social History:  Social History   Tobacco Use  . Smoking status: Former Smoker    Last attempt to quit: 05/20/1994    Years since quitting: 23.3  . Smokeless tobacco: Never Used  Substance Use Topics  . Alcohol use: No    Frequency: Never    Allergies  Allergen Reactions  . Codeine Itching    Current Outpatient Medications  Medication Sig Dispense Refill  . pantoprazole (PROTONIX) 40 MG tablet Take 40 mg by mouth daily.    . tamsulosin (FLOMAX) 0.4 MG CAPS capsule Take 0.4 mg by mouth at bedtime.     No current facility-administered medications for this visit.     Review of Systems  Constitutional:  Constitutional negative. HENT: HENT negative.  Eyes: Eyes negative.  Respiratory: Respiratory negative.  Cardiovascular: Cardiovascular negative.  GI:  Gastrointestinal negative.  GU: Positive for hematuria.  Skin: Skin negative.  Neurological: Neurological negative. Hematologic: Hematologic/lymphatic negative.  Psychiatric: Psychiatric negative.        Objective:  Objective   Vitals:   09/21/17 1124  BP: (!) 146/102  Pulse: 83  Resp: 16  Temp: 98.3 F (36.8 C)  TempSrc: Oral  SpO2: 98%  Weight: 208 lb 9.6 oz (94.6 kg)  Height: 5\' 11"  (1.803 m)   Body mass index is 29.09 kg/m.  Physical Exam  Constitutional: He is oriented to person, place, and time.  HENT:  Head: Normocephalic.  Eyes: Pupils are equal, round, and reactive to light.  Neck: Normal range of motion. Neck supple.  Cardiovascular: Normal rate.  Pulses:      Femoral pulses are 2+ on the right side, and 2+ on the left side.      Popliteal pulses are 2+ on the right side, and 2+ on the left side.  Pulmonary/Chest: Effort normal.  Abdominal: Soft. He exhibits no mass.  Musculoskeletal: Normal range of motion. He exhibits no edema.  Neurological: He is alert and oriented to person, place, and time.  Skin: Skin is warm and dry.  Psychiatric: He has a normal mood and affect. His behavior is normal. Judgment and thought content normal.    Data: IMPRESSION: 1. Foley catheter within  a thick-walled bladder with mild adjacent stranding/inflammation. Correlate with infection. 2. Low-density structures within the central LEFT kidney - likely parapelvic cysts rather than mild hydronephrosis. 3. 4.2 cm abdominal aortic aneurysm. Recommend followup by ultrasound in 1 year. This recommendation follows ACR consensus guidelines: White Paper of the ACR Incidental Findings Committee II on Vascular Findings. J Am Coll Radiol 2013; 10:789-794. 4. Small to moderate supraumbilical midline ventral hernia containing fat.   I reviewed the CAT scan with the patient at the bedside which demonstrates an infrarenal 4.3 cm aneurysm that is actually bilobed but does not extended  to the common iliac arteries.  This CT scan is without contrast does not demonstrate the flow channels.     Assessment/Plan:     72 year old male with recent noncontrasted CT scan demonstrated abdominal aortic aneurysm that is infrarenal in nature.  Given that we do not know the time course of progression of his aneurysm will get him to follow-up in 6 months with aortic duplex at which time if it is the same size he will have one year follow-up.  I have counseled him on the signs and symptoms of rupture as well as the expected progression of aneurysm of his size.  If he gets over 5 cm we will consider CT scan to plan repair.  We discussed the 2 types of repair as well.  I have suggested that he should not take any fluoroquinolone antibiotics given some incidence of rupture and growth of aneurysms.  He demonstrates good understanding we will see him in 6 months with repeat duplex.     Troy HarmanBrandon Blackburn Troy Dimarco MD [facility]

## 2017-10-22 ENCOUNTER — Other Ambulatory Visit: Payer: Self-pay

## 2017-10-22 DIAGNOSIS — I714 Abdominal aortic aneurysm, without rupture, unspecified: Secondary | ICD-10-CM

## 2018-03-19 ENCOUNTER — Encounter: Payer: Self-pay | Admitting: Physical Therapy

## 2018-03-19 ENCOUNTER — Ambulatory Visit: Payer: Medicare Other | Attending: Sports Medicine | Admitting: Physical Therapy

## 2018-03-19 ENCOUNTER — Other Ambulatory Visit: Payer: Self-pay

## 2018-03-19 DIAGNOSIS — G8929 Other chronic pain: Secondary | ICD-10-CM | POA: Insufficient documentation

## 2018-03-19 DIAGNOSIS — M25512 Pain in left shoulder: Secondary | ICD-10-CM | POA: Diagnosis present

## 2018-03-19 DIAGNOSIS — M6281 Muscle weakness (generalized): Secondary | ICD-10-CM | POA: Diagnosis present

## 2018-03-19 NOTE — Therapy (Signed)
[facility] [address] [place], [place], [address] Phone: [phone]   Fax:  [phone]  Physical Therapy Evaluation  Patient Details  Name: Troy Blackburn MRN: [id number] Date of Birth: 1945-12-06 Referring Provider (PT): Elinor Dodge MD   Encounter Date: 03/19/2018  PT End of Session - 03/19/18 1121    Visit Number  1    Number of Visits  12    Date for PT Re-Evaluation  04/30/18    PT Start Time  1026    PT Stop Time  1116    PT Time Calculation (min)  50 min    Activity Tolerance  Patient tolerated treatment well    Behavior During Therapy  [facility] for tasks assessed/performed       Past Medical History:  Diagnosis Date  . Amaurosis fugax 09/29/2013  . Hypertension   . Kidney stone   . Prostate disease     Past Surgical History:  Procedure Laterality Date  . CERVICAL SPINE SURGERY  2008  . FOOT SURGERY  2008  . TOTAL KNEE ARTHROPLASTY Left 2008   Dr Charlann Boxer  . TOTAL KNEE ARTHROPLASTY Right 2008  . URETERAL STENT PLACEMENT  2000   urethral stone    There were no vitals filed for this visit.   Subjective Assessment - 03/19/18 1126    Subjective  The patient presents to the clinic today with left shoulder pain ongoing for about a year.  His pain-level upon presentation tot he clinic today is a 4/10.  "Working" increases his left shoulder pain even more so.  Rest decreases hispain.    Pertinent History  Bilateral total knee replacements; cervical surgery and foot surgery.    Diagnostic tests  MRI:  Full thickness tear retracted 4 cm of left Supraspinatus; full thickness, partial width tears of Infraspinatus and Subscapularis; Severe left ACJ OA.    Patient Stated Goals  Use left arm with less pain.    Currently in Pain?  Yes    Pain Score  4     Pain Location  Shoulder    Pain Orientation  Left    Pain Descriptors / Indicators  Aching    Pain Type  Chronic pain    Pain Onset  More than a month ago    Pain  Frequency  Intermittent    Aggravating Factors   See above.    Pain Relieving Factors  See above.         [facility] PT Assessment - 03/19/18 0001      Assessment   Medical Diagnosis  Non-traumatic RTC tear, left.    Referring Provider (PT)  Elinor Dodge MD    Onset Date/Surgical Date  --   ~one year.     Precautions   Precautions  --   Very gentle and low-level left shoulder ther ex.     Restrictions   Weight Bearing Restrictions  No      Balance Screen   Has the patient fallen in the past 6 months  Yes    How many times?  --   4.   Has the patient had a decrease in activity level because of a fear of falling?   No    Is the patient reluctant to leave their home because of a fear of falling?   No      Home Public house manager residence      Prior Function   Level of Independence  Independent  Posture/Postural Control   Posture/Postural Control  Postural limitations    Postural Limitations  Rounded Shoulders;Forward head    Posture Comments  Anterior translation of left humeral head, left ACJ very prominent; atrophy over left Infraspinatus fossa.      ROM / Strength   AROM / PROM / Strength  AROM;Strength      AROM   Overall AROM Comments  Antigravity left shoulder flexion= 160 degrees; ER= 78 degrees and IR is full.      Strength   Overall Strength Comments  Relatively fluid left shoulder antigravity movement of left shoulder.  Did not specifically manually muscle test due to RTC deficiency.      Palpation   Palpation comment  Patient c/o diffuse left shoulder pain anteriorly, over Infraspinatus and left ACJ.      Special Tests   Other special tests  (-) left Drop Arm test.      Ambulation/Gait   Gait Comments  Slow and cautious with shortened step length and Trendelenburg gait pattern.                Objective measurements completed on examination: See above findings.      [facility] Adult PT Treatment/Exercise -  03/19/18 0001      Modalities   Modalities  Electrical Stimulation      Electrical Stimulation   Electrical Stimulation Location  Left shoulder.    Electrical Stimulation Action  IFC    Electrical Stimulation Parameters  80-150 Hz x 20 minutes.    Electrical Stimulation Goals  Pain               PT Short Term Goals - 03/19/18 1230      PT SHORT TERM GOAL #1   Title  STG's=LTG's.        PT Long Term Goals - 03/19/18 1230      PT LONG TERM GOAL #1   Title  Independent with a HEP.    Time  6      PT LONG TERM GOAL #2   Title  Perform ADL's with left shoulder pain not > 3/10.    Time  6    Period  Weeks    Status  New             Plan - 03/19/18 1222    Clinical Impression Statement  The patient presents to OPPT with c/o left shoulder pain that has been ongoing for approximately a year.  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  He actually has very good antigravity movement but the more he performs ADL's the more his left shoulder hurts. Patient will benefit from skilled physical therapy intervention to address pain and deficits.      History and Personal Factors relevant to plan of care:  Bilateral total knee replacements; cervical surgery and foot surgery.    Clinical Presentation  Evolving    Clinical Presentation due to:  Not improving.    Clinical Decision Making  Moderate    Rehab Potential  Good    PT Frequency  2x / week    PT Duration  6 weeks    PT Treatment/Interventions  Electrical Stimulation;Ultrasound;Therapeutic exercise;Therapeutic activities;Neuromuscular re-education;Patient/family education;Passive range of motion;Dry needling;Manual techniques;Vasopneumatic Device    PT Next Visit Plan  UBE; UE Ranger; cane exercises; wall ladder; ball on wall; isometrics and low-level strengthening exercises.    Consulted and Agree with Plan of Care  Patient  Patient will benefit from skilled therapeutic  intervention in order to improve the following deficits and impairments:  Pain, Postural dysfunction, Decreased strength, Decreased activity tolerance  Visit Diagnosis: Chronic left shoulder pain - Plan: PT plan of care cert/re-cert  Muscle weakness (generalized) - Plan: PT plan of care cert/re-cert     Problem List There are no active problems to display for this patient.   APPLE[place], [place] MPT 03/19/2018, 12:33 PM  [facility] [address] [place], [place], [address] Phone: [phone]   Fax:  [phone]  Name: Troy Blackburn MRN: [id number] Date of Birth: 03-01-1946

## 2018-03-21 ENCOUNTER — Ambulatory Visit: Payer: Medicare Other | Admitting: Physical Therapy

## 2018-03-21 DIAGNOSIS — M25512 Pain in left shoulder: Principal | ICD-10-CM

## 2018-03-21 DIAGNOSIS — M6281 Muscle weakness (generalized): Secondary | ICD-10-CM

## 2018-03-21 DIAGNOSIS — G8929 Other chronic pain: Secondary | ICD-10-CM

## 2018-03-21 NOTE — Therapy (Signed)
[facility] [address] [place], [place], [address] Phone: [phone]   Fax:  [phone]  Physical Therapy Treatment  Patient Details  Name: Troy Blackburn MRN: [id number] Date of Birth: 11-11-1945 Referring Provider (PT): Elinor Dodge MD   Encounter Date: 03/21/2018  PT End of Session - 03/21/18 1313    Visit Number  2    Number of Visits  12    Date for PT Re-Evaluation  04/30/18    PT Start Time  1115    PT Stop Time  1209    PT Time Calculation (min)  54 min    Activity Tolerance  Patient tolerated treatment well    Behavior During Therapy  [facility] for tasks assessed/performed       Past Medical History:  Diagnosis Date  . Amaurosis fugax 09/29/2013  . Hypertension   . Kidney stone   . Prostate disease     Past Surgical History:  Procedure Laterality Date  . CERVICAL SPINE SURGERY  2008  . FOOT SURGERY  2008  . TOTAL KNEE ARTHROPLASTY Left 2008   Dr Charlann Boxer  . TOTAL KNEE ARTHROPLASTY Right 2008  . URETERAL STENT PLACEMENT  2000   urethral stone    There were no vitals filed for this visit.  Subjective Assessment - 03/21/18 1314    Subjective  No new complaints.    Diagnostic tests  MRI:  Full thickness tear retracted 4 cm of left Supraspinatus; full thickness, partial width tears of Infraspinatus and Subscapularis; Severe left ACJ OA.    Patient Stated Goals  Use left arm with less pain.    Currently in Pain?  Yes    Pain Score  4     Pain Location  Shoulder    Pain Orientation  Left    Pain Descriptors / Indicators  Aching    Pain Onset  More than a month ago                       [facility] Adult PT Treatment/Exercise - 03/21/18 0001      Exercises   Exercises  Shoulder      Shoulder Exercises: Pulleys   Flexion  5 minutes      Shoulder Exercises: ROM/Strengthening   UBE (Upper Arm Bike)  10 minutes at 120 RPM's.    Ranger  5 minutes into flexion and circle (CW and CCW).    Other  ROM/Strengthening Exercises  Wall ladder x 7 minutes.      Programme researcher, broadcasting/film/video Location  left shoulder.    Electrical Stimulation Action  IFC    Electrical Stimulation Parameters  80-150 Hz x 20 minutes.    Electrical Stimulation Goals  Pain               PT Short Term Goals - 03/19/18 1230      PT SHORT TERM GOAL #1   Title  STG's=LTG's.        PT Long Term Goals - 03/19/18 1230      PT LONG TERM GOAL #1   Title  Independent with a HEP.    Time  6      PT LONG TERM GOAL #2   Title  Perform ADL's with left shoulder pain not > 3/10.    Time  6    Period  Weeks    Status  New            Plan -  03/21/18 1317    Clinical Impression Statement  Patient did an excellent job today with ther ex.  No complaints.      PT Next Visit Plan  UBE; UE Ranger; cane exercises; wall ladder; ball on wall; isometrics and low-level strengthening exercises.    Consulted and Agree with Plan of Care  Patient       Patient will benefit from skilled therapeutic intervention in order to improve the following deficits and impairments:     Visit Diagnosis: Chronic left shoulder pain  Muscle weakness (generalized)     Problem List There are no active problems to display for this patient.   Jynesis Nakamura, [place] MPT 03/21/2018, 1:19 PM  [facility] [address] [place], [place], [address] Phone: [phone]   Fax:  [phone]  Name: Troy Blackburn MRN: [id number] Date of Birth: May 16, 1946

## 2018-03-28 ENCOUNTER — Encounter: Payer: Medicare Other | Admitting: Physical Therapy

## 2018-03-29 ENCOUNTER — Ambulatory Visit (HOSPITAL_COMMUNITY)
Admission: RE | Admit: 2018-03-29 | Discharge: 2018-03-29 | Disposition: A | Discharge status: Home / Self Care | Payer: Medicare Other | Source: Ambulatory Visit | Attending: Vascular Surgery | Admitting: Vascular Surgery

## 2018-03-29 ENCOUNTER — Ambulatory Visit (INDEPENDENT_AMBULATORY_CARE_PROVIDER_SITE_OTHER): Payer: Medicare Other | Admitting: Vascular Surgery

## 2018-03-29 ENCOUNTER — Encounter: Payer: Self-pay | Admitting: Vascular Surgery

## 2018-03-29 ENCOUNTER — Other Ambulatory Visit: Payer: Self-pay

## 2018-03-29 VITALS — BP 122/84 | HR 62 | Resp 18 | Ht 71.0 in | Wt 211.3 lb

## 2018-03-29 DIAGNOSIS — I714 Abdominal aortic aneurysm, without rupture, unspecified: Secondary | ICD-10-CM

## 2018-03-29 NOTE — Progress Notes (Signed)
Patient ID: Troy Blackburn, male   DOB: May 20, 1946, 72 y.o.   MRN: [id number]  Reason for Consult: Follow-up (6 month f/u )   Referred by Eartha Inch, MD  Subjective:     HPI:  Troy Blackburn is a 72 y.o. male follows up for evaluation of abdominal aortic aneurysm.  This was incidentally found earlier this year on CT scan for renal stones.  He has no new back or abdominal pain.  He remains very active.  Does not have history of stroke.  Risk factors include hypertension former smoker.  Does not take blood thinners.  Past Medical History:  Diagnosis Date  . AAA (abdominal aortic aneurysm) (HCC)   . Amaurosis fugax 09/29/2013  . Hypertension   . Kidney stone   . Prostate disease    History reviewed. No pertinent family history. Past Surgical History:  Procedure Laterality Date  . CERVICAL SPINE SURGERY  2008  . FOOT SURGERY  2008  . TOTAL KNEE ARTHROPLASTY Left 2008   Dr Charlann Boxer  . TOTAL KNEE ARTHROPLASTY Right 2008  . URETERAL STENT PLACEMENT  2000   urethral stone    Short Social History:  Social History   Tobacco Use  . Smoking status: Former Smoker    Last attempt to quit: 05/20/1994    Years since quitting: 23.8  . Smokeless tobacco: Never Used  Substance Use Topics  . Alcohol use: No    Frequency: Never    Allergies  Allergen Reactions  . Codeine Itching    Current Outpatient Medications  Medication Sig Dispense Refill  . pantoprazole (PROTONIX) 40 MG tablet Take 40 mg by mouth daily.    . tamsulosin (FLOMAX) 0.4 MG CAPS capsule Take 0.4 mg by mouth at bedtime.    Marland Kitchen BIOTIN PO Take by mouth.    . TURMERIC PO Take by mouth.     No current facility-administered medications for this visit.     Review of Systems  Constitutional:  Constitutional negative. HENT: HENT negative.  Eyes: Eyes negative.  Respiratory: Respiratory negative.  Cardiovascular: Cardiovascular negative.  Skin: Skin negative.  Neurological: Neurological  negative. Hematologic: Hematologic/lymphatic negative.  Psychiatric: Psychiatric negative.        Objective:  Objective   Vitals:   03/29/18 0937  BP: 122/84  Pulse: 62  Resp: 18  SpO2: 97%  Weight: 211 lb 4.8 oz (95.8 kg)  Height: 5\' 11"  (1.803 m)   Body mass index is 29.47 kg/m.  Physical Exam  Constitutional: He is oriented to person, place, and time. He appears well-developed.  HENT:  Head: Normocephalic.  Eyes: Pupils are equal, round, and reactive to light.  Neck: Normal range of motion. Neck supple.  Cardiovascular: Normal rate.  Pulses:      Carotid pulses are 2+ on the right side, and 2+ on the left side.      Radial pulses are 2+ on the right side, and 2+ on the left side.       Popliteal pulses are 2+ on the right side, and 2+ on the left side.  Pulmonary/Chest: Effort normal.  Abdominal: Soft.  Musculoskeletal: Normal range of motion. He exhibits no edema.  Neurological: He is alert and oriented to person, place, and time.  Skin: Skin is warm and dry. Capillary refill takes less than 2 seconds.  Psychiatric: He has a normal mood and affect. His behavior is normal. Judgment and thought content normal.    Data: I have independently interpreted his abdominal  aortic duplex which demonstrates essentially a bilobed aneurysm measuring 4.3 cm infrarenally and extending down to the bifurcation.     Assessment/Plan:     72 year old male with stable abdominal aortic aneurysm.  We discussed the signs and symptoms of rupture as well as the fact that he is a very low risk at 4.3 cm.  At that we have 2 data points both of which demonstrate the same size we will see him in 1 year with repeat duplex.  If he has symptoms in the interim we will certainly see him sooner and if it is urgent and will call 911.     Maeola Harman MD [facility]

## 2018-04-18 ENCOUNTER — Ambulatory Visit: Payer: Medicare Other | Attending: Sports Medicine | Admitting: *Deleted

## 2018-04-18 DIAGNOSIS — M6281 Muscle weakness (generalized): Secondary | ICD-10-CM

## 2018-04-18 DIAGNOSIS — G8929 Other chronic pain: Secondary | ICD-10-CM

## 2018-04-18 DIAGNOSIS — M25512 Pain in left shoulder: Secondary | ICD-10-CM | POA: Insufficient documentation

## 2018-04-18 NOTE — Therapy (Addendum)
[facility] [place], [place], [address] Phone: [phone]   Fax:  [phone]  Physical Therapy Treatment  Patient Details  Name: ROCKIE SCHNOOR MRN: [id number] Date of Birth: 07/05/1945 Referring Provider (PT): Alphonzo Grieve MD   Encounter Date: 04/18/2018  PT End of Session - 04/18/18 1135    Visit Number  3    Number of Visits  12    Date for PT Re-Evaluation  04/30/18    PT Start Time  1115    PT Stop Time  1205    PT Time Calculation (min)  50 min    Activity Tolerance  Patient tolerated treatment well    Behavior During Therapy  [facility] for tasks assessed/performed       Past Medical History:  Diagnosis Date  . AAA (abdominal aortic aneurysm) ([place])   . Amaurosis fugax 09/29/2013  . Hypertension   . Kidney stone   . Prostate disease     Past Surgical History:  Procedure Laterality Date  . CERVICAL SPINE SURGERY  2008  . FOOT SURGERY  2008  . TOTAL KNEE ARTHROPLASTY Left 2008   Dr Alvan Dame  . TOTAL KNEE ARTHROPLASTY Right 2008  . URETERAL STENT PLACEMENT  2000   urethral stone    There were no vitals filed for this visit.  Subjective Assessment - 04/18/18 1136    Subjective  LT shldr doing good. 1/10     Pertinent History  Bilateral total knee replacements; cervical surgery and foot surgery.    Diagnostic tests  MRI:  Full thickness tear retracted 4 cm of left Supraspinatus; full thickness, partial width tears of Infraspinatus and Subscapularis; Severe left ACJ OA.    Patient Stated Goals  Use left arm with less pain.    Currently in Pain?  Yes    Pain Score  4     Pain Location  Shoulder    Pain Orientation  Left    Pain Descriptors / Indicators  Aching    Pain Type  Chronic pain    Pain Onset  More than a month ago    Pain Frequency  Intermittent                       [facility] Adult PT Treatment/Exercise - 04/18/18 0001      Exercises   Exercises  Shoulder      Shoulder  Exercises: Pulleys   Flexion  5 minutes      Shoulder Exercises: ROM/Strengthening   UBE (Upper Arm Bike)  10 minutes at 120 RPM's.    Ranger  5 minutes into flexion and circle (CW and CCW).    Other ROM/Strengthening Exercises  x 50mns      Modalities   Modalities  Electrical Stimulation      Electrical Stimulation   Electrical Stimulation Location  left shoulder.  IFC x 80-'150hz'$  x15 mins    Electrical Stimulation Goals  Pain               PT Short Term Goals - 03/19/18 1230      PT SHORT TERM GOAL #1   Title  STG's=LTG's.        PT Long Term Goals - 04/18/18 1141      PT LONG TERM GOAL #2   Title  Perform ADL's with left shoulder pain not > 3/10.    Time  6    Period  Weeks    Status  On-going  Plan - 04/18/18 1141    Clinical Impression Statement  Pt arrived today doing very well with minimal pain LT shldr. He was able to perform all therex today without complaints except soreness.Marland Kitchen His ROM was 160 degrees of flexion, ER to 80 degrees.. Normal response to Estim    Clinical Presentation  Evolving    Rehab Potential  Good    PT Frequency  2x / week    PT Duration  6 weeks    PT Treatment/Interventions  Electrical Stimulation;Ultrasound;Therapeutic exercise;Therapeutic activities;Neuromuscular re-education;Patient/family education;Passive range of motion;Dry needling;Manual techniques;Vasopneumatic Device    PT Next Visit Plan  UBE; UE Ranger; cane exercises; wall ladder; ball on wall; isometrics and low-level strengthening exercises.    Consulted and Agree with Plan of Care  Patient       Patient will benefit from skilled therapeutic intervention in order to improve the following deficits and impairments:  Pain, Postural dysfunction, Decreased strength, Decreased activity tolerance  Visit Diagnosis: Chronic left shoulder pain  Muscle weakness (generalized)     Problem List There are no active problems to display for this  patient.   ,CHRIS, PTA 04/18/2018, 12:12 PM  [facility] [address] [place], [place], [address] Phone: [phone]   Fax:  [phone]  Name: NISSAN FRAZZINI MRN: [id number] Date of Birth: 1946/02/13  PHYSICAL THERAPY DISCHARGE SUMMARY  Visits from Start of Care: 3.  Current functional level related to goals / functional outcomes: See above.   Remaining deficits: See below.   Education / Equipment: HEP. Plan: Patient agrees to discharge.  Patient goals were not met. Patient is being discharged due to not returning since the last visit.  ?????         Mali Applegate MPT

## 2020-01-09 IMAGING — CT CT RENAL STONE PROTOCOL
2 of 4 series · 16 of 46 positions shown, 18 images · non-contrast
Comparison: None.

CLINICAL DATA: 71-year-old male with abdominal and pelvic pain with
difficulty urinating. Recently started to self-catheterize.

EXAM:
CT ABDOMEN AND PELVIS WITHOUT CONTRAST
TECHNIQUE: Multidetector CT imaging of the abdomen and pelvis was performed
following the standard protocol without IV contrast.

[Series 2: axial st · axial · 0.81mm/px · z∈[+1566,+1981]mm · 13 of 91 slices shown, 15 images]
[im 4/91  soft-tissue]
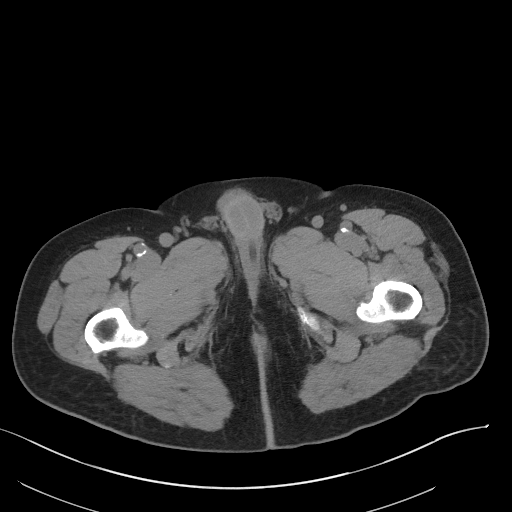
[im 4/91  bone]
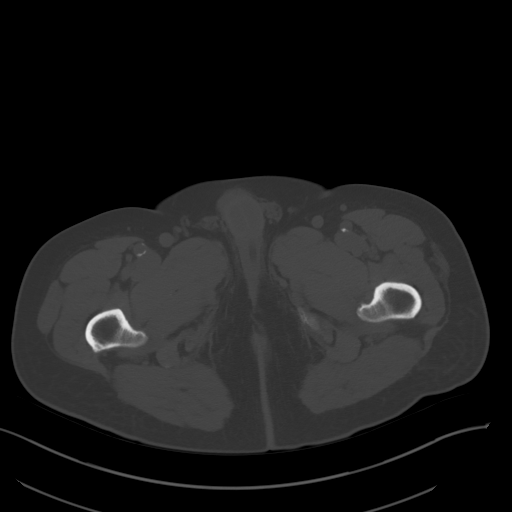
[im 12/91  soft-tissue]
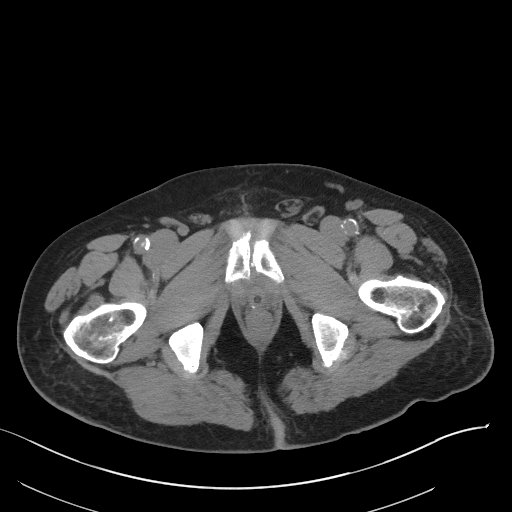
[im 20/91  soft-tissue]
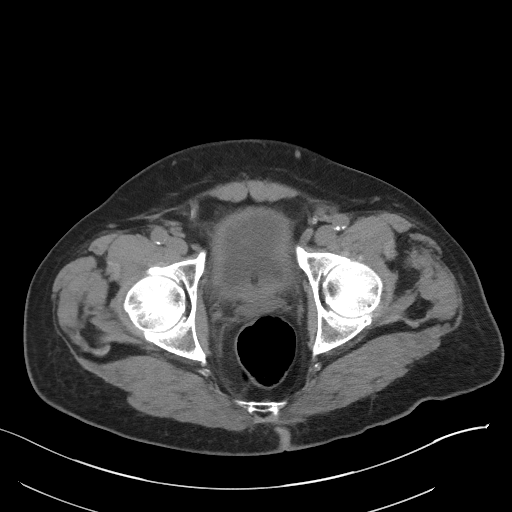
[im 24/91  soft-tissue]
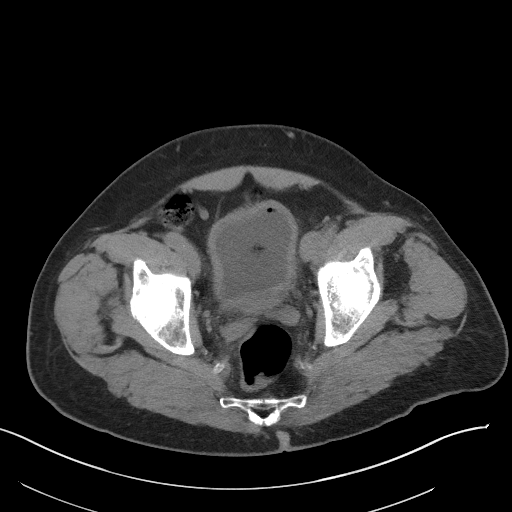
[im 32/91  soft-tissue]
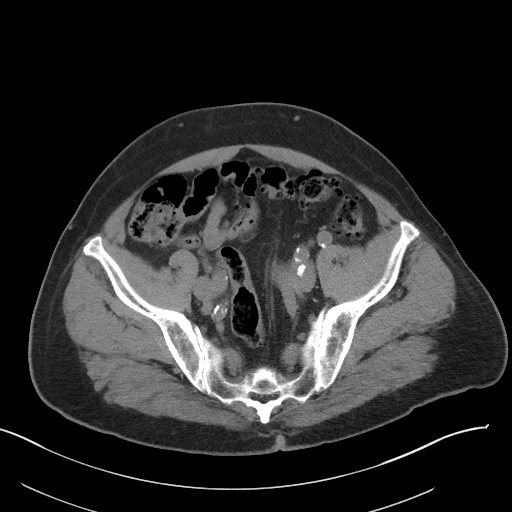
[im 40/91  soft-tissue]
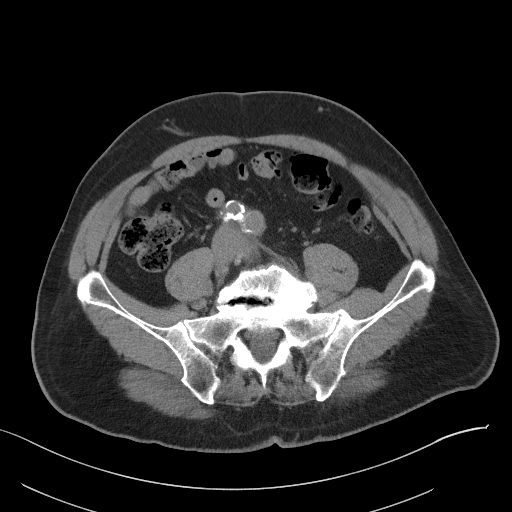
[im 47/91  soft-tissue]
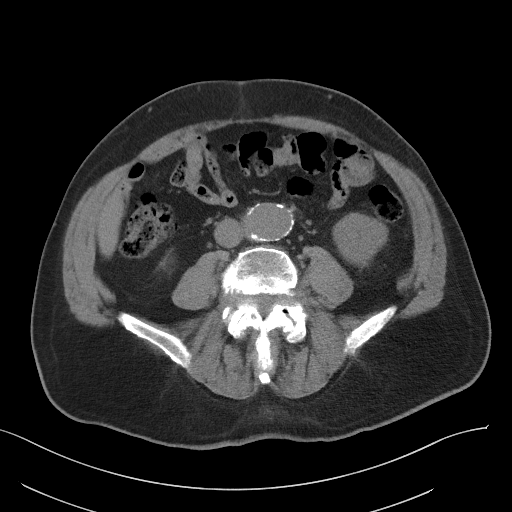
[im 51/91  soft-tissue]
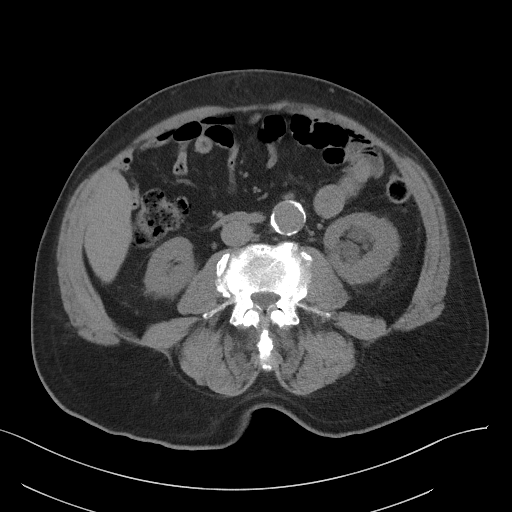
[im 59/91  soft-tissue]
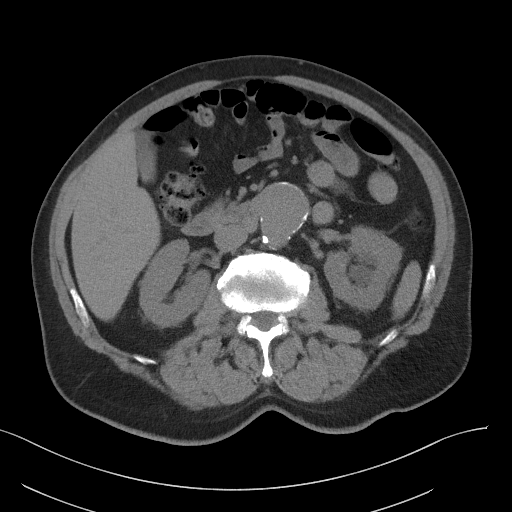
[im 59/91  bone]
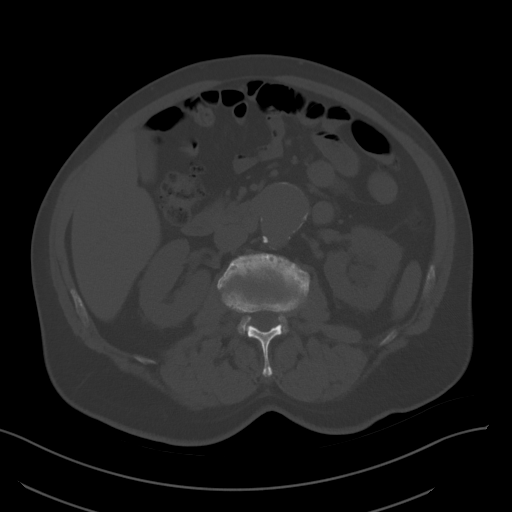
[im 67/91  soft-tissue]
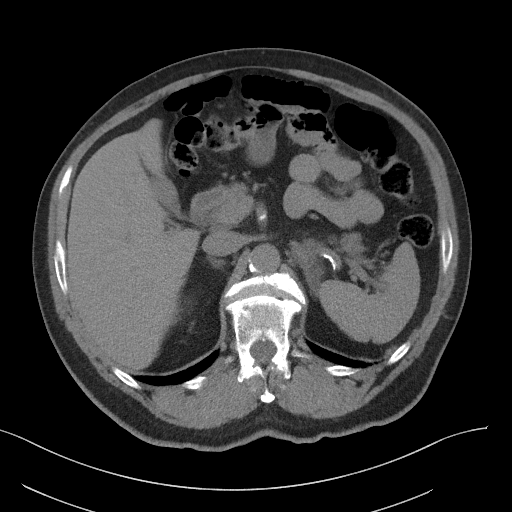
[im 71/91  soft-tissue]
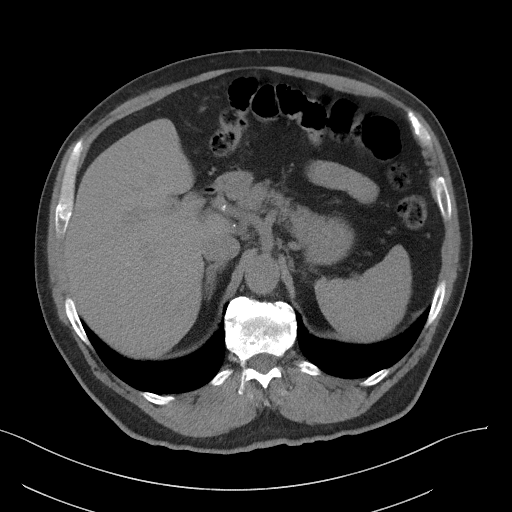
[im 79/91  soft-tissue]
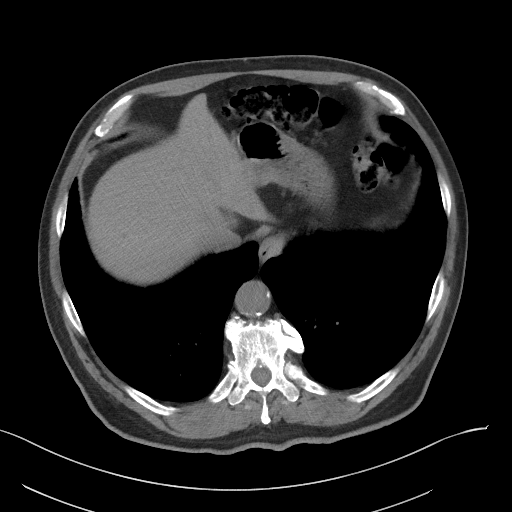
[im 87/91  soft-tissue]
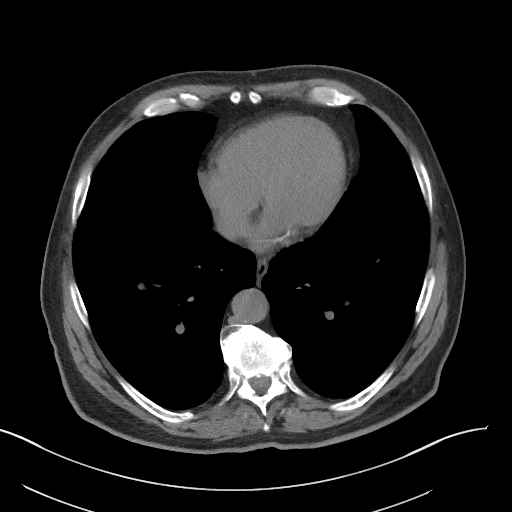

[Series 5: coronal st · coronal · 0.82mm/px · 3 of 115 slices shown]
[im 39/115  soft-tissue]
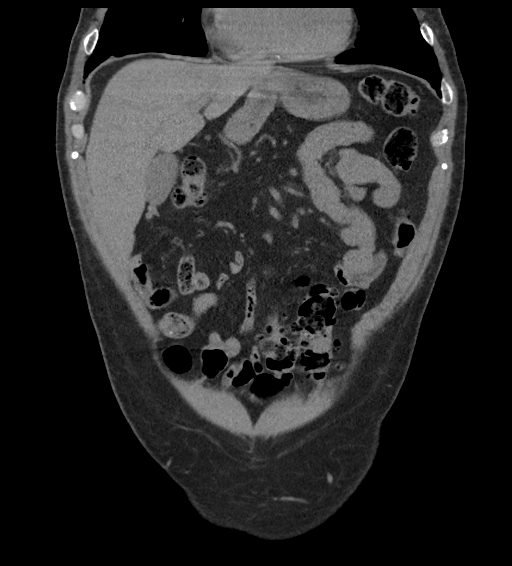
[im 51/115  soft-tissue]
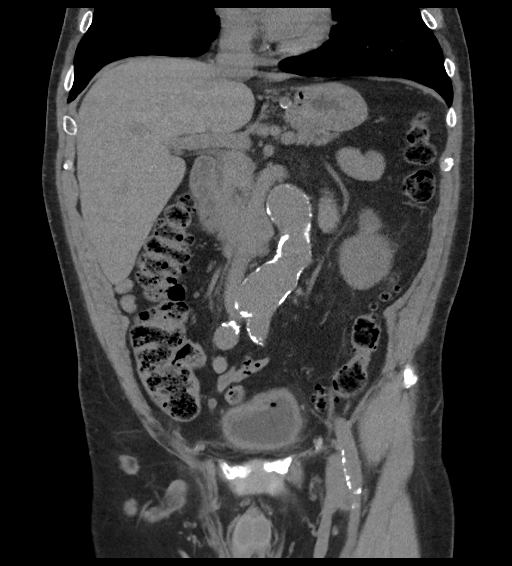
[im 64/115  soft-tissue]
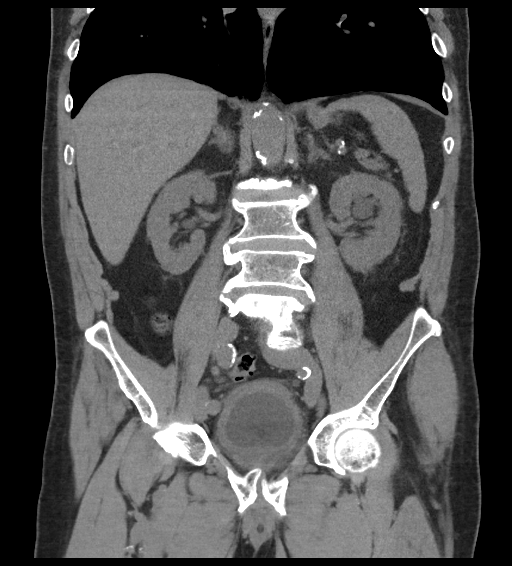

[16 of 46 positions shown; findings below may reference images not displayed]

FINDINGS: Please note that parenchymal abnormalities may be missed without
intravenous contrast.

Lower chest: No acute abnormality.

Hepatobiliary: The liver and gallbladder are unremarkable. No
biliary dilatation.

Pancreas: Unremarkable

Spleen: Unremarkable

Adrenals/Urinary Tract: Low density structures within the central
LEFT kidney more likely represent parapelvic cysts and mild
hydronephrosis as there is no dilatation of the LEFT renal pelvis or
ureter.

The RIGHT kidney is unremarkable.

A Foley catheter within a thick-walled bladder noted with mild
perivesical stranding/inflammation.

LEFT adrenal hyperplasia in noted.

Stomach/Bowel: Stomach is within normal limits. Appendix appears
normal. No evidence of bowel wall thickening, distention, or
inflammatory changes. Colonic diverticulosis noted without evidence
of diverticulitis.

Vascular/Lymphatic: Infrarenal abdominal aortic aneurysm noted with
the mid abdominal aorta measuring 4.2 cm in the distal abdominal
aorta measuring 3.5 cm in greatest diameter. Aortic atherosclerotic
calcifications are present. No enlarged lymph nodes are identified.

Reproductive: Prostate appears mildly enlarged.

Other: No ascites or focal collection. A small to moderate
supraumbilical midline ventral hernia containing only fat is noted.

Musculoskeletal: No acute abnormality or suspicious bony lesion.
Multilevel degenerative changes throughout the lumbar spine
identified.
IMPRESSION: 1. Foley catheter within a thick-walled bladder with mild adjacent
stranding/inflammation. Correlate with infection.
2. Low-density structures within the central LEFT kidney - likely
parapelvic cysts rather than mild hydronephrosis.
3. 4.2 cm abdominal aortic aneurysm. Recommend followup by
ultrasound in 1 year. This recommendation follows ACR consensus
guidelines: White Paper of the ACR Incidental Findings Committee II
on Vascular Findings. [HOSPITAL] 6786; [DATE].
4. Small to moderate supraumbilical midline ventral hernia
containing fat.

## 2021-10-30 LAB — EXTERNAL GENERIC LAB PROCEDURE: COLOGUARD: NEGATIVE
